# Patient Record
Sex: Female | Born: 1980 | Race: White | Hispanic: No | Marital: Married | Smoking: Never smoker
Health system: Southern US, Community
[De-identification: ages and names within clinical notes are randomized; demographics above are authoritative.]

## PROBLEM LIST (undated history)

## (undated) DIAGNOSIS — T7840XA Allergy, unspecified, initial encounter: Secondary | ICD-10-CM

## (undated) HISTORY — DX: Allergy, unspecified, initial encounter: T78.40XA

## (undated) HISTORY — PX: FRACTURE SURGERY: SHX138

---

## 2006-07-06 ENCOUNTER — Other Ambulatory Visit: Admission: RE | Admit: 2006-07-06 | Discharge: 2006-07-06 | Payer: Self-pay | Admitting: Obstetrics and Gynecology

## 2008-08-12 ENCOUNTER — Other Ambulatory Visit: Admission: RE | Admit: 2008-08-12 | Discharge: 2008-08-12 | Payer: Self-pay | Admitting: Obstetrics and Gynecology

## 2009-10-12 ENCOUNTER — Other Ambulatory Visit: Admission: RE | Admit: 2009-10-12 | Discharge: 2009-10-12 | Payer: Self-pay | Admitting: Obstetrics and Gynecology

## 2013-04-18 ENCOUNTER — Other Ambulatory Visit: Payer: Self-pay | Admitting: Obstetrics and Gynecology

## 2013-04-18 ENCOUNTER — Other Ambulatory Visit (HOSPITAL_COMMUNITY)
Admission: RE | Admit: 2013-04-18 | Discharge: 2013-04-18 | Disposition: A | Discharge status: Home / Self Care | Payer: BC Managed Care – PPO | Source: Ambulatory Visit | Attending: Obstetrics and Gynecology | Admitting: Obstetrics and Gynecology

## 2013-04-18 DIAGNOSIS — Z01419 Encounter for gynecological examination (general) (routine) without abnormal findings: Secondary | ICD-10-CM | POA: Insufficient documentation

## 2013-04-18 DIAGNOSIS — Z1151 Encounter for screening for human papillomavirus (HPV): Secondary | ICD-10-CM | POA: Insufficient documentation

## 2014-04-23 ENCOUNTER — Other Ambulatory Visit (HOSPITAL_COMMUNITY)
Admission: RE | Admit: 2014-04-23 | Discharge: 2014-04-23 | Disposition: A | Discharge status: Home / Self Care | Payer: BC Managed Care – PPO | Source: Ambulatory Visit | Attending: Obstetrics and Gynecology | Admitting: Obstetrics and Gynecology

## 2014-04-23 ENCOUNTER — Other Ambulatory Visit: Payer: Self-pay | Admitting: Obstetrics and Gynecology

## 2014-04-23 DIAGNOSIS — Z01419 Encounter for gynecological examination (general) (routine) without abnormal findings: Secondary | ICD-10-CM | POA: Insufficient documentation

## 2014-04-27 LAB — CYTOLOGY - PAP

## 2015-12-06 ENCOUNTER — Other Ambulatory Visit: Payer: Self-pay | Admitting: Obstetrics and Gynecology

## 2015-12-06 ENCOUNTER — Other Ambulatory Visit (HOSPITAL_COMMUNITY)
Admission: RE | Admit: 2015-12-06 | Discharge: 2015-12-06 | Disposition: A | Discharge status: Home / Self Care | Payer: BC Managed Care – PPO | Source: Ambulatory Visit | Attending: Obstetrics and Gynecology | Admitting: Obstetrics and Gynecology

## 2015-12-06 DIAGNOSIS — Z01419 Encounter for gynecological examination (general) (routine) without abnormal findings: Secondary | ICD-10-CM | POA: Diagnosis present

## 2015-12-09 LAB — CYTOLOGY - PAP

## 2016-12-06 ENCOUNTER — Other Ambulatory Visit (HOSPITAL_COMMUNITY)
Admission: RE | Admit: 2016-12-06 | Discharge: 2016-12-06 | Disposition: A | Discharge status: Home / Self Care | Payer: BC Managed Care – PPO | Source: Ambulatory Visit | Attending: Obstetrics and Gynecology | Admitting: Obstetrics and Gynecology

## 2016-12-06 ENCOUNTER — Other Ambulatory Visit: Payer: Self-pay | Admitting: Obstetrics and Gynecology

## 2016-12-06 DIAGNOSIS — Z01411 Encounter for gynecological examination (general) (routine) with abnormal findings: Secondary | ICD-10-CM | POA: Insufficient documentation

## 2016-12-06 DIAGNOSIS — Z1151 Encounter for screening for human papillomavirus (HPV): Secondary | ICD-10-CM | POA: Insufficient documentation

## 2016-12-11 LAB — CYTOLOGY - PAP
Diagnosis: NEGATIVE
HPV: NOT DETECTED

## 2019-03-24 ENCOUNTER — Other Ambulatory Visit: Payer: Self-pay | Admitting: *Deleted

## 2019-03-24 DIAGNOSIS — Z20822 Contact with and (suspected) exposure to covid-19: Secondary | ICD-10-CM

## 2019-03-27 NOTE — Addendum Note (Signed)
Addended by: Jazzmyne Rasnick M on: 03/27/2019 04:29 PM   Modules accepted: Orders  

## 2019-04-08 ENCOUNTER — Other Ambulatory Visit: Payer: Self-pay | Admitting: Obstetrics and Gynecology

## 2019-04-08 DIAGNOSIS — Z1231 Encounter for screening mammogram for malignant neoplasm of breast: Secondary | ICD-10-CM

## 2019-05-20 ENCOUNTER — Other Ambulatory Visit: Payer: Self-pay

## 2019-05-20 ENCOUNTER — Ambulatory Visit
Admission: RE | Admit: 2019-05-20 | Discharge: 2019-05-20 | Disposition: A | Discharge status: Home / Self Care | Payer: 59 | Source: Ambulatory Visit | Attending: Obstetrics and Gynecology | Admitting: Obstetrics and Gynecology

## 2019-05-20 DIAGNOSIS — Z1231 Encounter for screening mammogram for malignant neoplasm of breast: Secondary | ICD-10-CM

## 2019-09-12 DIAGNOSIS — R7989 Other specified abnormal findings of blood chemistry: Secondary | ICD-10-CM | POA: Insufficient documentation

## 2019-09-12 DIAGNOSIS — E782 Mixed hyperlipidemia: Secondary | ICD-10-CM | POA: Insufficient documentation

## 2020-05-17 ENCOUNTER — Other Ambulatory Visit: Payer: Self-pay | Admitting: Obstetrics and Gynecology

## 2020-05-17 DIAGNOSIS — Z1231 Encounter for screening mammogram for malignant neoplasm of breast: Secondary | ICD-10-CM

## 2020-05-25 ENCOUNTER — Ambulatory Visit
Admission: RE | Admit: 2020-05-25 | Discharge: 2020-05-25 | Disposition: A | Discharge status: Home / Self Care | Payer: 59 | Source: Ambulatory Visit | Attending: Obstetrics and Gynecology | Admitting: Obstetrics and Gynecology

## 2020-05-25 ENCOUNTER — Other Ambulatory Visit: Payer: Self-pay

## 2020-05-25 DIAGNOSIS — Z1231 Encounter for screening mammogram for malignant neoplasm of breast: Secondary | ICD-10-CM

## 2021-01-21 ENCOUNTER — Emergency Department (HOSPITAL_COMMUNITY): Payer: 59

## 2021-01-21 ENCOUNTER — Encounter (HOSPITAL_COMMUNITY): Payer: Self-pay

## 2021-01-21 ENCOUNTER — Emergency Department (HOSPITAL_COMMUNITY)
Admission: EM | Admit: 2021-01-21 | Discharge: 2021-01-21 | Disposition: A | Discharge status: Home / Self Care | Payer: 59 | Attending: Emergency Medicine | Admitting: Emergency Medicine

## 2021-01-21 ENCOUNTER — Encounter: Payer: Self-pay | Admitting: Emergency Medicine

## 2021-01-21 ENCOUNTER — Other Ambulatory Visit (HOSPITAL_COMMUNITY): Payer: Self-pay | Admitting: Emergency Medicine

## 2021-01-21 ENCOUNTER — Other Ambulatory Visit: Payer: Self-pay

## 2021-01-21 DIAGNOSIS — Y9301 Activity, walking, marching and hiking: Secondary | ICD-10-CM | POA: Diagnosis not present

## 2021-01-21 DIAGNOSIS — R52 Pain, unspecified: Secondary | ICD-10-CM

## 2021-01-21 DIAGNOSIS — M542 Cervicalgia: Secondary | ICD-10-CM | POA: Insufficient documentation

## 2021-01-21 DIAGNOSIS — S0093XA Contusion of unspecified part of head, initial encounter: Secondary | ICD-10-CM | POA: Diagnosis not present

## 2021-01-21 DIAGNOSIS — M25511 Pain in right shoulder: Secondary | ICD-10-CM | POA: Diagnosis not present

## 2021-01-21 DIAGNOSIS — M25532 Pain in left wrist: Secondary | ICD-10-CM | POA: Insufficient documentation

## 2021-01-21 DIAGNOSIS — M25571 Pain in right ankle and joints of right foot: Secondary | ICD-10-CM | POA: Diagnosis not present

## 2021-01-21 DIAGNOSIS — S0990XA Unspecified injury of head, initial encounter: Secondary | ICD-10-CM | POA: Diagnosis present

## 2021-01-21 DIAGNOSIS — Y92480 Sidewalk as the place of occurrence of the external cause: Secondary | ICD-10-CM | POA: Diagnosis not present

## 2021-01-21 DIAGNOSIS — Z20822 Contact with and (suspected) exposure to covid-19: Secondary | ICD-10-CM | POA: Diagnosis not present

## 2021-01-21 DIAGNOSIS — S62102A Fracture of unspecified carpal bone, left wrist, initial encounter for closed fracture: Secondary | ICD-10-CM

## 2021-01-21 DIAGNOSIS — S3991XA Unspecified injury of abdomen, initial encounter: Secondary | ICD-10-CM | POA: Insufficient documentation

## 2021-01-21 DIAGNOSIS — T1490XA Injury, unspecified, initial encounter: Secondary | ICD-10-CM

## 2021-01-21 LAB — PROTIME-INR
INR: 1 (ref 0.8–1.2)
Prothrombin Time: 13 seconds (ref 11.4–15.2)

## 2021-01-21 LAB — CBC
HCT: 37.3 % (ref 36.0–46.0)
Hemoglobin: 12.5 g/dL (ref 12.0–15.0)
MCH: 30 pg (ref 26.0–34.0)
MCHC: 33.5 g/dL (ref 30.0–36.0)
MCV: 89.4 fL (ref 80.0–100.0)
Platelets: 242 10*3/uL (ref 150–400)
RBC: 4.17 MIL/uL (ref 3.87–5.11)
RDW: 11.9 % (ref 11.5–15.5)
WBC: 6.9 10*3/uL (ref 4.0–10.5)
nRBC: 0 % (ref 0.0–0.2)

## 2021-01-21 LAB — URINALYSIS, ROUTINE W REFLEX MICROSCOPIC
Bilirubin Urine: NEGATIVE
Glucose, UA: NEGATIVE mg/dL
Ketones, ur: 5 mg/dL — AB
Nitrite: NEGATIVE
Protein, ur: NEGATIVE mg/dL
Specific Gravity, Urine: >1.046 — ABNORMAL HIGH (ref 1.005–1.030)
pH: 6 (ref 5.0–8.0)

## 2021-01-21 LAB — COMPREHENSIVE METABOLIC PANEL
ALT: 13 U/L (ref 0–44)
AST: 21 U/L (ref 15–41)
Albumin: 3.7 g/dL (ref 3.5–5.0)
Alkaline Phosphatase: 60 U/L (ref 38–126)
Anion gap: 7 (ref 5–15)
BUN: 10 mg/dL (ref 6–20)
CO2: 23 mmol/L (ref 22–32)
Calcium: 8.8 mg/dL — ABNORMAL LOW (ref 8.9–10.3)
Chloride: 104 mmol/L (ref 98–111)
Creatinine, Ser: 0.71 mg/dL (ref 0.44–1.00)
GFR, Estimated: >60 mL/min (ref >60)
Glucose, Bld: 156 mg/dL — ABNORMAL HIGH (ref 70–99)
Potassium: 3.3 mmol/L — ABNORMAL LOW (ref 3.5–5.1)
Sodium: 134 mmol/L — ABNORMAL LOW (ref 135–145)
Total Bilirubin: 0.6 mg/dL (ref 0.3–1.2)
Total Protein: 7.2 g/dL (ref 6.5–8.1)

## 2021-01-21 LAB — ETHANOL: Alcohol, Ethyl (B): <10 mg/dL (ref <10)

## 2021-01-21 LAB — I-STAT BETA HCG BLOOD, ED (MC, WL, AP ONLY): I-stat hCG, quantitative: <5 m[IU]/mL (ref <5)

## 2021-01-21 LAB — I-STAT CHEM 8, ED
BUN: 10 mg/dL (ref 6–20)
Calcium, Ion: 1.06 mmol/L — ABNORMAL LOW (ref 1.15–1.40)
Chloride: 104 mmol/L (ref 98–111)
Creatinine, Ser: 0.6 mg/dL (ref 0.44–1.00)
Glucose, Bld: 152 mg/dL — ABNORMAL HIGH (ref 70–99)
HCT: 36 % (ref 36.0–46.0)
Hemoglobin: 12.2 g/dL (ref 12.0–15.0)
Potassium: 3.4 mmol/L — ABNORMAL LOW (ref 3.5–5.1)
Sodium: 138 mmol/L (ref 135–145)
TCO2: 21 mmol/L — ABNORMAL LOW (ref 22–32)

## 2021-01-21 LAB — RESP PANEL BY RT-PCR (FLU A&B, COVID) ARPGX2
Influenza A by PCR: NEGATIVE
Influenza B by PCR: NEGATIVE
SARS Coronavirus 2 by RT PCR: NEGATIVE

## 2021-01-21 LAB — LACTIC ACID, PLASMA: Lactic Acid, Venous: 2.1 mmol/L (ref 0.5–1.9)

## 2021-01-21 MED ORDER — FENTANYL CITRATE (PF) 100 MCG/2ML IJ SOLN
50.0000 ug | Freq: Once | INTRAMUSCULAR | Status: AC
  Administered 2021-01-21: 50 ug via INTRAVENOUS
  Filled 2021-01-21: qty 2

## 2021-01-21 MED ORDER — FENTANYL CITRATE (PF) 100 MCG/2ML IJ SOLN
50.0000 ug | Freq: Once | INTRAMUSCULAR | Status: AC
Start: 2021-01-21 — End: 2021-01-21
  Administered 2021-01-21: 50 ug via INTRAVENOUS
  Filled 2021-01-21: qty 2

## 2021-01-21 MED ORDER — LIDOCAINE HCL (PF) 1 % IJ SOLN
30.0000 mL | Freq: Once | INTRAMUSCULAR | Status: AC
  Administered 2021-01-21: 30 mL via INTRADERMAL
  Filled 2021-01-21: qty 30

## 2021-01-21 MED ORDER — IOHEXOL 300 MG/ML  SOLN
100.0000 mL | Freq: Once | INTRAMUSCULAR | Status: AC | PRN
  Administered 2021-01-21: 100 mL via INTRAVENOUS
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
## 2021-01-21 NOTE — ED Notes (Signed)
Pt back in room from CT 
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
## 2021-01-21 NOTE — ED Notes (Signed)
Reviewed discharge instructions with patient and spouse. Follow-up care and pain management reviewed. Patient and spouse verbalized understanding. Patient A&Ox4, VSS upon discharge.
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
## 2021-01-22 LAB — SAMPLE TO BLOOD BANK

## 2021-01-25 ENCOUNTER — Other Ambulatory Visit: Payer: Self-pay

## 2021-01-25 ENCOUNTER — Encounter (HOSPITAL_BASED_OUTPATIENT_CLINIC_OR_DEPARTMENT_OTHER): Payer: Self-pay | Admitting: Orthopaedic Surgery

## 2021-01-27 ENCOUNTER — Other Ambulatory Visit (HOSPITAL_COMMUNITY)
Admission: RE | Admit: 2021-01-27 | Discharge: 2021-01-27 | Disposition: A | Discharge status: Home / Self Care | Payer: 59 | Source: Ambulatory Visit | Attending: Orthopaedic Surgery | Admitting: Orthopaedic Surgery

## 2021-01-27 DIAGNOSIS — Z01812 Encounter for preprocedural laboratory examination: Secondary | ICD-10-CM | POA: Insufficient documentation

## 2021-01-27 DIAGNOSIS — Z20822 Contact with and (suspected) exposure to covid-19: Secondary | ICD-10-CM | POA: Diagnosis not present

## 2021-01-27 LAB — SARS CORONAVIRUS 2 (TAT 6-24 HRS): SARS Coronavirus 2: NEGATIVE

## 2021-01-31 ENCOUNTER — Ambulatory Visit (HOSPITAL_BASED_OUTPATIENT_CLINIC_OR_DEPARTMENT_OTHER)
Admission: RE | Admit: 2021-01-31 | Discharge: 2021-01-31 | Disposition: A | Discharge status: Home / Self Care | Payer: 59 | Attending: Orthopaedic Surgery | Admitting: Orthopaedic Surgery

## 2021-01-31 ENCOUNTER — Encounter (HOSPITAL_BASED_OUTPATIENT_CLINIC_OR_DEPARTMENT_OTHER)
Admission: RE | Disposition: A | Discharge status: Home / Self Care | Payer: Self-pay | Source: Home / Self Care | Attending: Orthopaedic Surgery

## 2021-01-31 ENCOUNTER — Ambulatory Visit (HOSPITAL_BASED_OUTPATIENT_CLINIC_OR_DEPARTMENT_OTHER): Payer: 59 | Admitting: Anesthesiology

## 2021-01-31 ENCOUNTER — Other Ambulatory Visit: Payer: Self-pay

## 2021-01-31 ENCOUNTER — Encounter (HOSPITAL_BASED_OUTPATIENT_CLINIC_OR_DEPARTMENT_OTHER): Payer: Self-pay | Admitting: Orthopaedic Surgery

## 2021-01-31 DIAGNOSIS — Z881 Allergy status to other antibiotic agents status: Secondary | ICD-10-CM | POA: Diagnosis not present

## 2021-01-31 DIAGNOSIS — S52572A Other intraarticular fracture of lower end of left radius, initial encounter for closed fracture: Secondary | ICD-10-CM | POA: Insufficient documentation

## 2021-01-31 HISTORY — PX: OPEN REDUCTION INTERNAL FIXATION (ORIF) DISTAL RADIAL FRACTURE: SHX5989

## 2021-01-31 LAB — POCT PREGNANCY, URINE: Preg Test, Ur: NEGATIVE

## 2021-01-31 SURGERY — OPEN REDUCTION INTERNAL FIXATION (ORIF) DISTAL RADIUS FRACTURE
Anesthesia: Monitor Anesthesia Care | Site: Wrist | Laterality: Left

## 2021-01-31 MED ORDER — ROPIVACAINE HCL 5 MG/ML IJ SOLN
INTRAMUSCULAR | Status: DC | PRN
  Administered 2021-01-31: 20 mL via PERINEURAL

## 2021-01-31 MED ORDER — PROPOFOL 500 MG/50ML IV EMUL
INTRAVENOUS | Status: DC | PRN
  Administered 2021-01-31: 75 ug/kg/min via INTRAVENOUS

## 2021-01-31 MED ORDER — LACTATED RINGERS IV SOLN: INTRAVENOUS | Status: DC

## 2021-01-31 MED ORDER — ACETAMINOPHEN 500 MG PO TABS
ORAL_TABLET | ORAL | Status: AC
  Filled 2021-01-31: qty 2

## 2021-01-31 MED ORDER — DEXAMETHASONE SODIUM PHOSPHATE 10 MG/ML IJ SOLN
INTRAMUSCULAR | Status: DC | PRN
  Administered 2021-01-31: 5 mg

## 2021-01-31 MED ORDER — PROPOFOL 10 MG/ML IV BOLUS
INTRAVENOUS | Status: AC
  Filled 2021-01-31: qty 20

## 2021-01-31 MED ORDER — ONDANSETRON HCL 4 MG/2ML IJ SOLN
INTRAMUSCULAR | Status: AC
  Filled 2021-01-31: qty 2

## 2021-01-31 MED ORDER — MIDAZOLAM HCL 2 MG/2ML IJ SOLN
2.0000 mg | Freq: Once | INTRAMUSCULAR | Status: AC
  Administered 2021-01-31: 2 mg via INTRAVENOUS

## 2021-01-31 MED ORDER — PROPOFOL 500 MG/50ML IV EMUL
INTRAVENOUS | Status: AC
  Filled 2021-01-31: qty 50

## 2021-01-31 MED ORDER — CHLORHEXIDINE GLUCONATE 4 % EX LIQD: 60.0000 mL | Freq: Once | CUTANEOUS | Status: DC

## 2021-01-31 MED ORDER — FENTANYL CITRATE (PF) 100 MCG/2ML IJ SOLN
INTRAMUSCULAR | Status: AC
  Filled 2021-01-31: qty 2

## 2021-01-31 MED ORDER — ONDANSETRON HCL 4 MG/2ML IJ SOLN
INTRAMUSCULAR | Status: DC | PRN
  Administered 2021-01-31: 4 mg via INTRAVENOUS

## 2021-01-31 MED ORDER — VANCOMYCIN HCL IN DEXTROSE 1-5 GM/200ML-% IV SOLN: 1000.0000 mg | INTRAVENOUS | Status: DC

## 2021-01-31 MED ORDER — HYDROCODONE-ACETAMINOPHEN 5-325 MG PO TABS: 2.0000 | ORAL_TABLET | Freq: Four times a day (QID) | ORAL | 0 refills | Status: AC | PRN

## 2021-01-31 MED ORDER — POVIDONE-IODINE 10 % EX SWAB: 2.0000 "application " | Freq: Once | CUTANEOUS | Status: DC

## 2021-01-31 MED ORDER — FENTANYL CITRATE (PF) 100 MCG/2ML IJ SOLN
50.0000 ug | Freq: Once | INTRAMUSCULAR | Status: AC
  Administered 2021-01-31: 50 ug via INTRAVENOUS

## 2021-01-31 MED ORDER — MIDAZOLAM HCL 2 MG/2ML IJ SOLN
INTRAMUSCULAR | Status: AC
  Filled 2021-01-31: qty 2

## 2021-01-31 MED ORDER — FENTANYL CITRATE (PF) 100 MCG/2ML IJ SOLN: 25.0000 ug | INTRAMUSCULAR | Status: DC | PRN

## 2021-01-31 MED ORDER — ACETAMINOPHEN 500 MG PO TABS
1000.0000 mg | ORAL_TABLET | Freq: Once | ORAL | Status: AC
  Administered 2021-01-31: 1000 mg via ORAL

## 2021-01-31 MED ORDER — VANCOMYCIN HCL IN DEXTROSE 1-5 GM/200ML-% IV SOLN
INTRAVENOUS | Status: AC
  Filled 2021-01-31: qty 200

## 2021-01-31 MED ORDER — VANCOMYCIN HCL 1000 MG IV SOLR
INTRAVENOUS | Status: DC | PRN
  Administered 2021-01-31: 1000 mg via INTRAVENOUS

## 2021-01-31 SURGICAL SUPPLY — 77 items
APL PRP STRL LF DISP 70% ISPRP (MISCELLANEOUS) ×1
BIT DRILL 2.0 LNG QUCK RELEASE (BIT) IMPLANT
BIT DRILL QC 2.8X5 (BIT) ×1 IMPLANT
BLADE SURG 15 STRL LF DISP TIS (BLADE) ×2 IMPLANT
BLADE SURG 15 STRL SS (BLADE) ×4
BNDG CMPR 9X4 STRL LF SNTH (GAUZE/BANDAGES/DRESSINGS) ×1
BNDG ELASTIC 3X5.8 VLCR STR LF (GAUZE/BANDAGES/DRESSINGS) ×2 IMPLANT
BNDG ELASTIC 4X5.8 VLCR STR LF (GAUZE/BANDAGES/DRESSINGS) ×2 IMPLANT
BNDG ESMARK 4X9 LF (GAUZE/BANDAGES/DRESSINGS) ×2 IMPLANT
BNDG GAUZE ELAST 4 BULKY (GAUZE/BANDAGES/DRESSINGS) ×2 IMPLANT
BRUSH SCRUB EZ PLAIN DRY (MISCELLANEOUS) IMPLANT
CANISTER SUCT 1200ML W/VALVE (MISCELLANEOUS) ×2 IMPLANT
CHLORAPREP W/TINT 26 (MISCELLANEOUS) ×2 IMPLANT
CORD BIPOLAR FORCEPS 12FT (ELECTRODE) ×2 IMPLANT
COVER BACK TABLE 60X90IN (DRAPES) ×2 IMPLANT
CUFF TOURN SGL QUICK 18X4 (TOURNIQUET CUFF) ×1 IMPLANT
DRAPE EXTREMITY T 121X128X90 (DISPOSABLE) ×2 IMPLANT
DRAPE OEC MINIVIEW 54X84 (DRAPES) ×2 IMPLANT
DRAPE SURG 17X23 STRL (DRAPES) ×2 IMPLANT
DRILL 2.0 LNG QUICK RELEASE (BIT) ×2
GAUZE 4X4 16PLY RFD (DISPOSABLE) IMPLANT
GAUZE SPONGE 4X4 12PLY STRL (GAUZE/BANDAGES/DRESSINGS) ×2 IMPLANT
GAUZE XEROFORM 1X8 LF (GAUZE/BANDAGES/DRESSINGS) ×1 IMPLANT
GAUZE XEROFORM 5X9 LF (GAUZE/BANDAGES/DRESSINGS) IMPLANT
GLOVE SRG 8 PF TXTR STRL LF DI (GLOVE) ×1 IMPLANT
GLOVE SURG ENC MOIS LTX SZ6.5 (GLOVE) ×3 IMPLANT
GLOVE SURG SYN 7.5  E (GLOVE)
GLOVE SURG SYN 7.5 E (GLOVE) IMPLANT
GLOVE SURG SYN 7.5 PF PI (GLOVE) IMPLANT
GLOVE SURG UNDER POLY LF SZ7 (GLOVE) ×3 IMPLANT
GLOVE SURG UNDER POLY LF SZ8 (GLOVE) ×2
GOWN STRL REUS W/ TWL LRG LVL3 (GOWN DISPOSABLE) ×1 IMPLANT
GOWN STRL REUS W/ TWL XL LVL3 (GOWN DISPOSABLE) ×1 IMPLANT
GOWN STRL REUS W/TWL LRG LVL3 (GOWN DISPOSABLE) ×2
GOWN STRL REUS W/TWL XL LVL3 (GOWN DISPOSABLE) ×4
GUIDEWIRE ORTHO 0.054X6 (WIRE) ×3 IMPLANT
NDL HYPO 25X1 1.5 SAFETY (NEEDLE) IMPLANT
NEEDLE HYPO 22GX1.5 SAFETY (NEEDLE) IMPLANT
NEEDLE HYPO 25X1 1.5 SAFETY (NEEDLE) IMPLANT
NS IRRIG 1000ML POUR BTL (IV SOLUTION) ×2 IMPLANT
PACK BASIN DAY SURGERY FS (CUSTOM PROCEDURE TRAY) ×2 IMPLANT
PAD CAST 3X4 CTTN HI CHSV (CAST SUPPLIES) ×2 IMPLANT
PAD CAST 4YDX4 CTTN HI CHSV (CAST SUPPLIES) ×1 IMPLANT
PADDING CAST ABS 3INX4YD NS (CAST SUPPLIES) ×1
PADDING CAST ABS 4INX4YD NS (CAST SUPPLIES) ×1
PADDING CAST ABS COTTON 3X4 (CAST SUPPLIES) ×1 IMPLANT
PADDING CAST ABS COTTON 4X4 ST (CAST SUPPLIES) ×1 IMPLANT
PADDING CAST COTTON 3X4 STRL (CAST SUPPLIES) ×4
PADDING CAST COTTON 4X4 STRL (CAST SUPPLIES) ×2
PLATE LEFT DIST RADIUS NARROW (Plate) ×1 IMPLANT
SCREW BN FT 16X2.3XLCK HEX CRT (Screw) IMPLANT
SCREW CORT FT 18X2.3XLCK HEX (Screw) IMPLANT
SCREW CORTICAL LOCKING 2.3X14M (Screw) ×2 IMPLANT
SCREW CORTICAL LOCKING 2.3X16M (Screw) ×2 IMPLANT
SCREW CORTICAL LOCKING 2.3X18M (Screw) ×6 IMPLANT
SCREW LOCK 12X3.5X HEXALOBE (Screw) IMPLANT
SCREW LOCKING 3.5X10MM (Screw) ×1 IMPLANT
SCREW LOCKING 3.5X12 (Screw) ×2 IMPLANT
SCREW NON LOCK 3.5X10MM (Screw) ×1 IMPLANT
SHEET MEDIUM DRAPE 40X70 STRL (DRAPES) ×2 IMPLANT
SLING ARM FOAM STRAP LRG (SOFTGOODS) ×1 IMPLANT
SPLINT FIBERGLASS 3X35 (CAST SUPPLIES) ×1 IMPLANT
SPLINT PLASTER CAST XFAST 3X15 (CAST SUPPLIES) IMPLANT
SPLINT PLASTER XTRA FASTSET 3X (CAST SUPPLIES)
STOCKINETTE SYNTHETIC 3 UNSTER (CAST SUPPLIES) ×2 IMPLANT
SUCTION FRAZIER HANDLE 10FR (MISCELLANEOUS) ×2
SUCTION TUBE FRAZIER 10FR DISP (MISCELLANEOUS) ×1 IMPLANT
SUT ETHIBOND 3-0 V-5 (SUTURE) IMPLANT
SUT PROLENE 4 0 PS 2 18 (SUTURE) ×4 IMPLANT
SUT VIC AB 3-0 FS2 27 (SUTURE) ×2 IMPLANT
SYR BULB EAR ULCER 3OZ GRN STR (SYRINGE) ×2 IMPLANT
SYR CONTROL 10ML LL (SYRINGE) IMPLANT
TAPE SURG TRANSPORE 1 IN (GAUZE/BANDAGES/DRESSINGS) ×1 IMPLANT
TAPE SURGICAL TRANSPORE 1 IN (GAUZE/BANDAGES/DRESSINGS) ×2
TOWEL GREEN STERILE FF (TOWEL DISPOSABLE) ×4 IMPLANT
TUBE CONNECTING 20X1/4 (TUBING) ×2 IMPLANT
UNDERPAD 30X36 HEAVY ABSORB (UNDERPADS AND DIAPERS) ×2 IMPLANT
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
## 2021-01-31 NOTE — Anesthesia Postprocedure Evaluation (Signed)
Anesthesia Post Note  Patient: Pamela Kline  Procedure(s) Performed: OPEN REDUCTION INTERNAL FIXATION (ORIF) DISTAL RADIAL FRACTURE (Left Wrist)     Patient location during evaluation: PACU Anesthesia Type: Regional and MAC Level of consciousness: awake and alert Pain management: pain level controlled Vital Signs Assessment: post-procedure vital signs reviewed and stable Respiratory status: spontaneous breathing, nonlabored ventilation, respiratory function stable and patient connected to nasal cannula oxygen Cardiovascular status: stable and blood pressure returned to baseline Postop Assessment: no apparent nausea or vomiting Anesthetic complications: no   No complications documented.  Last Vitals:  Vitals:   01/31/21 0930 01/31/21 0945  BP: (!) 142/69 (!) 142/77  Pulse: 78 84  Resp: 15 16  Temp:    SpO2: 99% 97%    Last Pain:  Vitals:   01/31/21 0945  TempSrc:   PainSc: 0-No pain                 Tashiya Souders L Ndeye Tenorio

## 2021-01-31 NOTE — Progress Notes (Signed)
Assisted Dr. Woodrum with left, ultrasound guided, supraclavicular block. Side rails up, monitors on throughout procedure. See vital signs in flow sheet. Tolerated Procedure well. 
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
## 2021-01-31 NOTE — Discharge Instructions (Signed)
Discharge Instructions  - Keep dressings in place. Do not remove them. - The dressings must stay dry - Take all medication as prescribed. Transition to over the counter pain medication as your pain improves - Keep the hand elevated over the next 48-72 hours to help with pain and swelling - Move all digits not restricted by the dressings regularly to prevent stiffness - Please call to schedule a follow up appointment with Dr. Roney Mans and therapy at [phone] for 10-14 days following surgery- - Your pain medication have been sent digitally to your pharmacy  *No Tylenol until 1:45pm today 01/31/21  Post Anesthesia Home Care Instructions  Activity: Get plenty of rest for the remainder of the day. A responsible individual must stay with you for 24 hours following the procedure.  For the next 24 hours, DO NOT: -Drive a car -Advertising copywriter -Drink alcoholic beverages -Take any medication unless instructed by your physician -Make any legal decisions or sign important papers.  Meals: Start with liquid foods such as gelatin or soup. Progress to regular foods as tolerated. Avoid greasy, spicy, heavy foods. If nausea and/or vomiting occur, drink only clear liquids until the nausea and/or vomiting subsides. Call your physician if vomiting continues.  Special Instructions/Symptoms: Your throat may feel dry or sore from the anesthesia or the breathing tube placed in your throat during surgery. If this causes discomfort, gargle with warm salt water. The discomfort should disappear within 24 hours.  If you had a scopolamine patch placed behind your ear for the management of post- operative nausea and/or vomiting:  1. The medication in the patch is effective for 72 hours, after which it should be removed.  Wrap patch in a tissue and discard in the trash. Wash hands thoroughly with soap and water. 2. You may remove the patch earlier than 72 hours if you experience unpleasant side effects which  may include dry mouth, dizziness or visual disturbances. 3. Avoid touching the patch. Wash your hands with soap and water after contact with the patch.     Regional Anesthesia Blocks  1. Numbness or the inability to move the "blocked" extremity may last from 3-48 hours after placement. The length of time depends on the medication injected and your individual response to the medication. If the numbness is not going away after 48 hours, call your surgeon.  2. The extremity that is blocked will need to be protected until the numbness is gone and the  Strength has returned. Because you cannot feel it, you will need to take extra care to avoid injury. Because it may be weak, you may have difficulty moving it or using it. You may not know what position it is in without looking at it while the block is in effect.  3. For blocks in the legs and feet, returning to weight bearing and walking needs to be done carefully. You will need to wait until the numbness is entirely gone and the strength has returned. You should be able to move your leg and foot normally before you try and bear weight or walk. You will need someone to be with you when you first try to ensure you do not fall and possibly risk injury.  4. Bruising and tenderness at the needle site are common side effects and will resolve in a few days.  5. Persistent numbness or new problems with movement should be communicated to the surgeon or the [facility] [phone] [facility] [phone]).

## 2021-01-31 NOTE — Anesthesia Procedure Notes (Signed)
Anesthesia Regional Block: Supraclavicular block   Pre-Anesthetic Checklist: ,, timeout performed, Correct Patient, Correct Site, Correct Laterality, Correct Procedure, Correct Position, site marked, Risks and benefits discussed,  Surgical consent,  Pre-op evaluation,  At surgeon's request and post-op pain management  Laterality: Left  Prep: Maximum Sterile Barrier Precautions used, chloraprep       Needles:  Injection technique: Single-shot  Needle Type: Echogenic Stimulator Needle     Needle Length: 4cm  Needle Gauge: 22     Additional Needles:   Procedures:,,,, ultrasound used (permanent image in chart),,,,  Narrative:  Start time: 01/31/2021 7:55 AM End time: 01/31/2021 8:05 AM Injection made incrementally with aspirations every 5 mL.  Performed by: Personally  Anesthesiologist: Elmer Picker, MD  Additional Notes: Monitors applied. No increased pain on injection. No increased resistance to injection. Injection made in 5cc increments. Good needle visualization. Patient tolerated procedure well.

## 2021-01-31 NOTE — Transfer of Care (Signed)
Immediate Anesthesia Transfer of Care Note  Patient: Pamela Kline  Procedure(s) Performed: OPEN REDUCTION INTERNAL FIXATION (ORIF) DISTAL RADIAL FRACTURE (Left Wrist)  Patient Location: PACU  Anesthesia Type:MAC and Regional  Level of Consciousness: awake, alert  and oriented  Airway & Oxygen Therapy: spontaneously breathing with face mask oxygen  Post-op Assessment: Report given to RN and Post -op Vital signs reviewed and stable  Post vital signs: Reviewed and stable  Last Vitals:  Vitals Value Taken Time  BP 128/63 01/31/21 0919  Temp    Pulse 81 01/31/21 0920  Resp 14 01/31/21 0919  SpO2 100 % 01/31/21 0920  Vitals shown include unvalidated device data.  Last Pain:  Vitals:   01/31/21 0725  TempSrc: Oral  PainSc: 4          Complications: No complications documented.

## 2021-01-31 NOTE — Anesthesia Preprocedure Evaluation (Addendum)
Anesthesia Evaluation  Patient identified by MRN, date of birth, ID band Patient awake    Reviewed: Allergy & Precautions, NPO status , Patient's Chart, lab work & pertinent test results  Airway Mallampati: II  TM Distance: >3 FB Neck ROM: Full    Dental no notable dental hx. (+) Teeth Intact, Dental Advisory Given   Pulmonary neg pulmonary ROS,    Pulmonary exam normal breath sounds clear to auscultation       Cardiovascular Normal cardiovascular exam Rhythm:Regular Rate:Normal  HLD   Neuro/Psych negative neurological ROS  negative psych ROS   GI/Hepatic negative GI ROS, Neg liver ROS,   Endo/Other  negative endocrine ROS  Renal/GU negative Renal ROS  negative genitourinary   Musculoskeletal negative musculoskeletal ROS (+)   Abdominal   Peds  Hematology negative hematology ROS (+)   Anesthesia Other Findings   Reproductive/Obstetrics                            Anesthesia Physical Anesthesia Plan  ASA: II  Anesthesia Plan: MAC and Regional   Post-op Pain Management:  Regional for Post-op pain   Induction: Intravenous  PONV Risk Score and Plan: 2 and Propofol infusion, Treatment may vary due to age or medical condition, Midazolam, Ondansetron and Dexamethasone  Airway Management Planned: Natural Airway  Additional Equipment:   Intra-op Plan:   Post-operative Plan:   Informed Consent: I have reviewed the patients History and Physical, chart, labs and discussed the procedure including the risks, benefits and alternatives for the proposed anesthesia with the patient or authorized representative who has indicated his/her understanding and acceptance.     Dental advisory given  Plan Discussed with: CRNA  Anesthesia Plan Comments:         Anesthesia Quick Evaluation

## 2021-02-01 ENCOUNTER — Encounter (HOSPITAL_BASED_OUTPATIENT_CLINIC_OR_DEPARTMENT_OTHER): Payer: Self-pay | Admitting: Orthopaedic Surgery

## 2021-05-20 ENCOUNTER — Other Ambulatory Visit: Payer: Self-pay | Admitting: Obstetrics and Gynecology

## 2021-05-20 DIAGNOSIS — Z1231 Encounter for screening mammogram for malignant neoplasm of breast: Secondary | ICD-10-CM

## 2021-06-16 ENCOUNTER — Ambulatory Visit
Admission: RE | Admit: 2021-06-16 | Discharge: 2021-06-16 | Disposition: A | Discharge status: Home / Self Care | Payer: 59 | Source: Ambulatory Visit

## 2021-06-16 ENCOUNTER — Other Ambulatory Visit: Payer: Self-pay

## 2021-06-16 DIAGNOSIS — Z1231 Encounter for screening mammogram for malignant neoplasm of breast: Secondary | ICD-10-CM
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
## 2022-06-29 DIAGNOSIS — Z1231 Encounter for screening mammogram for malignant neoplasm of breast: Secondary | ICD-10-CM | POA: Diagnosis not present

## 2022-07-27 DIAGNOSIS — Z01419 Encounter for gynecological examination (general) (routine) without abnormal findings: Secondary | ICD-10-CM | POA: Diagnosis not present

## 2022-09-15 DIAGNOSIS — I10 Essential (primary) hypertension: Secondary | ICD-10-CM | POA: Diagnosis not present

## 2022-09-15 DIAGNOSIS — E782 Mixed hyperlipidemia: Secondary | ICD-10-CM | POA: Diagnosis not present

## 2022-09-15 DIAGNOSIS — E559 Vitamin D deficiency, unspecified: Secondary | ICD-10-CM | POA: Diagnosis not present

## 2022-12-13 DIAGNOSIS — E559 Vitamin D deficiency, unspecified: Secondary | ICD-10-CM | POA: Diagnosis not present

## 2022-12-13 DIAGNOSIS — Z09 Encounter for follow-up examination after completed treatment for conditions other than malignant neoplasm: Secondary | ICD-10-CM | POA: Diagnosis not present

## 2022-12-13 DIAGNOSIS — E782 Mixed hyperlipidemia: Secondary | ICD-10-CM | POA: Diagnosis not present

## 2022-12-13 DIAGNOSIS — Z0000 Encounter for general adult medical examination without abnormal findings: Secondary | ICD-10-CM | POA: Diagnosis not present

## 2023-07-31 DIAGNOSIS — Z124 Encounter for screening for malignant neoplasm of cervix: Secondary | ICD-10-CM | POA: Diagnosis not present

## 2023-07-31 DIAGNOSIS — Z01419 Encounter for gynecological examination (general) (routine) without abnormal findings: Secondary | ICD-10-CM | POA: Diagnosis not present

## 2023-07-31 DIAGNOSIS — Z1151 Encounter for screening for human papillomavirus (HPV): Secondary | ICD-10-CM | POA: Diagnosis not present

## 2023-08-01 LAB — HM PAP SMEAR: HPV, high-risk: NEGATIVE

## 2023-08-06 DIAGNOSIS — Z1231 Encounter for screening mammogram for malignant neoplasm of breast: Secondary | ICD-10-CM | POA: Diagnosis not present

## 2023-08-06 LAB — HM MAMMOGRAPHY

## 2023-11-19 DIAGNOSIS — N6459 Other signs and symptoms in breast: Secondary | ICD-10-CM | POA: Diagnosis not present

## 2023-12-17 DIAGNOSIS — Z7689 Persons encountering health services in other specified circumstances: Secondary | ICD-10-CM | POA: Diagnosis not present

## 2024-03-27 ENCOUNTER — Ambulatory Visit: Payer: Self-pay | Admitting: Internal Medicine

## 2024-03-27 ENCOUNTER — Encounter: Payer: Self-pay | Admitting: Internal Medicine

## 2024-03-27 VITALS — BP 110/80 | HR 82 | Temp 99.1°F | Ht 62.0 in | Wt 166.3 lb

## 2024-03-27 DIAGNOSIS — R7989 Other specified abnormal findings of blood chemistry: Secondary | ICD-10-CM | POA: Diagnosis not present

## 2024-03-27 DIAGNOSIS — E782 Mixed hyperlipidemia: Secondary | ICD-10-CM

## 2024-03-27 DIAGNOSIS — Z1211 Encounter for screening for malignant neoplasm of colon: Secondary | ICD-10-CM | POA: Diagnosis not present

## 2024-03-27 DIAGNOSIS — Z1159 Encounter for screening for other viral diseases: Secondary | ICD-10-CM

## 2024-03-27 DIAGNOSIS — Z114 Encounter for screening for human immunodeficiency virus [HIV]: Secondary | ICD-10-CM

## 2024-03-27 DIAGNOSIS — Z0000 Encounter for general adult medical examination without abnormal findings: Secondary | ICD-10-CM
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
## 2024-03-31 ENCOUNTER — Other Ambulatory Visit (INDEPENDENT_AMBULATORY_CARE_PROVIDER_SITE_OTHER)

## 2024-03-31 DIAGNOSIS — R7989 Other specified abnormal findings of blood chemistry: Secondary | ICD-10-CM | POA: Diagnosis not present

## 2024-03-31 DIAGNOSIS — Z114 Encounter for screening for human immunodeficiency virus [HIV]: Secondary | ICD-10-CM

## 2024-03-31 DIAGNOSIS — Z1159 Encounter for screening for other viral diseases: Secondary | ICD-10-CM | POA: Diagnosis not present

## 2024-03-31 DIAGNOSIS — E782 Mixed hyperlipidemia: Secondary | ICD-10-CM | POA: Diagnosis not present

## 2024-03-31 LAB — COMPREHENSIVE METABOLIC PANEL WITH GFR
ALT: 8 U/L (ref 0–35)
AST: 15 U/L (ref 0–37)
Albumin: 4.4 g/dL (ref 3.5–5.2)
Alkaline Phosphatase: 49 U/L (ref 39–117)
BUN: 16 mg/dL (ref 6–23)
CO2: 26 meq/L (ref 19–32)
Calcium: 9.2 mg/dL (ref 8.4–10.5)
Chloride: 104 meq/L (ref 96–112)
Creatinine, Ser: 0.58 mg/dL (ref 0.40–1.20)
GFR: 109.39 mL/min (ref >60.00)
Glucose, Bld: 102 mg/dL — ABNORMAL HIGH (ref 70–99)
Potassium: 4 meq/L (ref 3.5–5.1)
Sodium: 138 meq/L (ref 135–145)
Total Bilirubin: 0.6 mg/dL (ref 0.2–1.2)
Total Protein: 7.6 g/dL (ref 6.0–8.3)

## 2024-03-31 LAB — CBC WITH DIFFERENTIAL/PLATELET
Basophils Absolute: 0 10*3/uL (ref 0.0–0.1)
Basophils Relative: 0.5 % (ref 0.0–3.0)
Eosinophils Absolute: 0 10*3/uL (ref 0.0–0.7)
Eosinophils Relative: 0.6 % (ref 0.0–5.0)
HCT: 37.1 % (ref 36.0–46.0)
Hemoglobin: 12.6 g/dL (ref 12.0–15.0)
Lymphocytes Relative: 31.6 % (ref 12.0–46.0)
Lymphs Abs: 1.6 10*3/uL (ref 0.7–4.0)
MCHC: 34 g/dL (ref 30.0–36.0)
MCV: 88.6 fl (ref 78.0–100.0)
Monocytes Absolute: 0.4 10*3/uL (ref 0.1–1.0)
Monocytes Relative: 8.1 % (ref 3.0–12.0)
Neutro Abs: 2.9 10*3/uL (ref 1.4–7.7)
Neutrophils Relative %: 59.2 % (ref 43.0–77.0)
Platelets: 272 10*3/uL (ref 150.0–400.0)
RBC: 4.19 Mil/uL (ref 3.87–5.11)
RDW: 12.3 % (ref 11.5–15.5)
WBC: 4.9 10*3/uL (ref 4.0–10.5)

## 2024-03-31 LAB — LIPID PANEL
Cholesterol: 255 mg/dL — ABNORMAL HIGH (ref 0–200)
HDL: 47.6 mg/dL (ref >39.00)
LDL Cholesterol: 191 mg/dL — ABNORMAL HIGH (ref 0–99)
NonHDL: 207.83
Total CHOL/HDL Ratio: 5
Triglycerides: 86 mg/dL (ref 0.0–149.0)
VLDL: 17.2 mg/dL (ref 0.0–40.0)

## 2024-04-01 ENCOUNTER — Ambulatory Visit: Payer: Self-pay | Admitting: Internal Medicine

## 2024-04-01 DIAGNOSIS — E782 Mixed hyperlipidemia: Secondary | ICD-10-CM

## 2024-04-01 LAB — HIV ANTIBODY (ROUTINE TESTING W REFLEX): HIV 1&2 Ab, 4th Generation: NONREACTIVE

## 2024-04-01 LAB — HEPATITIS C ANTIBODY: Hepatitis C Ab: NONREACTIVE

## 2024-04-01 MED ORDER — ROSUVASTATIN CALCIUM 5 MG PO TABS: 5.0000 mg | ORAL_TABLET | Freq: Every day | ORAL | 1 refills | Status: DC

## 2024-05-22 ENCOUNTER — Encounter: Payer: Self-pay | Admitting: Pediatrics

## 2024-07-25 ENCOUNTER — Ambulatory Visit

## 2024-07-25 VITALS — Ht 62.0 in | Wt 166.0 lb

## 2024-07-25 DIAGNOSIS — Z1211 Encounter for screening for malignant neoplasm of colon: Secondary | ICD-10-CM

## 2024-07-25 MED ORDER — NA SULFATE-K SULFATE-MG SULF 17.5-3.13-1.6 GM/177ML PO SOLN: 1.0000 | Freq: Once | ORAL | 0 refills | Status: AC

## 2024-07-25 MED ORDER — ONDANSETRON HCL 4 MG PO TABS: 4.0000 mg | ORAL_TABLET | ORAL | 0 refills | Status: AC

## 2024-07-25 NOTE — Progress Notes (Signed)
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
## 2024-08-04 ENCOUNTER — Encounter: Payer: Self-pay | Admitting: Pediatrics

## 2024-08-04 ENCOUNTER — Ambulatory Visit: Admitting: Pediatrics

## 2024-08-04 VITALS — BP 116/67 | HR 79 | Temp 97.8°F | Resp 15 | Ht 62.0 in | Wt 166.0 lb

## 2024-08-04 DIAGNOSIS — Z1211 Encounter for screening for malignant neoplasm of colon: Secondary | ICD-10-CM

## 2024-08-04 MED ORDER — SODIUM CHLORIDE 0.9 % IV SOLN: 500.0000 mL | INTRAVENOUS | Status: DC

## 2024-08-04 NOTE — Progress Notes (Signed)
 Transferred to PACU via stretcher.  Not responding to stimulation at this time.  VSS upon leaving procedure room.

## 2024-08-04 NOTE — Op Note (Signed)
 [facility] Patient Name: Pamela Kline Procedure Date: 08/04/2024 10:52 AM MRN: [id number] Endoscopist: Inocente Hausen , MD, [id number] Age: 43 Referring MD:  Date of Birth: Jul 04, 1981 Gender: Female Account #: [id number] Procedure:                Colonoscopy Indications:              Screening for colorectal malignant neoplasm, This                            is the patient's first colonoscopy Medicines:                Monitored Anesthesia Care Procedure:                Pre-Anesthesia Assessment:                           - Prior to the procedure, a History and Physical                            was performed, and patient medications and                            allergies were reviewed. The patient's tolerance of                            previous anesthesia was also reviewed. The risks                            and benefits of the procedure and the sedation                            options and risks were discussed with the patient.                            All questions were answered, and informed consent                            was obtained. Prior Anticoagulants: The patient has                            taken no anticoagulant or antiplatelet agents. ASA                            Grade Assessment: II - A patient with mild systemic                            disease. After reviewing the risks and benefits,                            the patient was deemed in satisfactory condition to                            undergo the procedure.  After obtaining informed consent, the colonoscope                            was passed under direct vision. Throughout the                            procedure, the patient's blood pressure, pulse, and                            oxygen saturations were monitored continuously. The                            CF [id number] #[id number] was introduced through the anus                            and advanced to the  cecum, identified by                            appendiceal orifice and ileocecal valve. The                            colonoscopy was performed without difficulty. The                            patient tolerated the procedure well. The quality                            of the bowel preparation was good. The ileocecal                            valve, appendiceal orifice, and rectum were                            photographed. Scope In: 11:08:32 AM Scope Out: 11:22:21 AM Scope Withdrawal Time: 0 hours 10 minutes 23 seconds  Total Procedure Duration: 0 hours 13 minutes 49 seconds  Findings:                 The perianal and digital rectal examinations were                            normal. Pertinent negatives include normal                            sphincter tone and no palpable rectal lesions.                           The colon (entire examined portion) appeared normal.                           The retroflexed view of the distal rectum and anal                            verge was normal and showed no anal or rectal  abnormalities. Complications:            No immediate complications. Estimated blood loss:                            None. Estimated Blood Loss:     Estimated blood loss: none. Impression:               - The entire examined colon is normal.                           - The distal rectum and anal verge are normal on                            retroflexion view.                           - No specimens collected. Recommendation:           - Discharge patient to home (ambulatory).                           - Await pathology results.                           - Repeat colonoscopy in 10 years for screening                            purposes.                           - The findings and recommendations were discussed                            with the patient's family.                           - Return to referring physician.                            - Patient has a contact number available for                            emergencies. The signs and symptoms of potential                            delayed complications were discussed with the                            patient. Return to normal activities tomorrow.                            Written discharge instructions were provided to the                            patient. Inocente Hausen, MD 08/04/2024 11:27:04 AM This report has been signed electronically.

## 2024-08-04 NOTE — Patient Instructions (Signed)
   Continue previous diet & medications   YOU HAD AN ENDOSCOPIC PROCEDURE TODAY AT THE [facility]:   Refer to the procedure report that was given to you for any specific questions about what was found during the examination.  If the procedure report does not answer your questions, please call your gastroenterologist to clarify.  If you requested that your care partner not be given the details of your procedure findings, then the procedure report has been included in a sealed envelope for you to review at your convenience later.  YOU SHOULD EXPECT: Some feelings of bloating in the abdomen. Passage of more gas than usual.  Walking can help get rid of the air that was put into your GI tract during the procedure and reduce the bloating. If you had a lower endoscopy (such as a colonoscopy or flexible sigmoidoscopy) you may notice spotting of blood in your stool or on the toilet paper. If you underwent a bowel prep for your procedure, you may not have a normal bowel movement for a few days.  Please Note:  You might notice some irritation and congestion in your nose or some drainage.  This is from the oxygen used during your procedure.  There is no need for concern and it should clear up in a day or so.  SYMPTOMS TO REPORT IMMEDIATELY:  Following lower endoscopy (colonoscopy or flexible sigmoidoscopy):  Excessive amounts of blood in the stool  Significant tenderness or worsening of abdominal pains  Swelling of the abdomen that is new, acute  Fever of 100F or higher   For urgent or emergent issues, a gastroenterologist can be reached at any hour by calling [phone]. Do not use MyChart messaging for urgent concerns.    DIET:  We do recommend a small meal at first, but then you may proceed to your regular diet.  Drink plenty of fluids but you should avoid alcoholic beverages for 24 hours.  ACTIVITY:  You should plan to take it easy for the rest of today and you should NOT DRIVE or  use heavy machinery until tomorrow (because of the sedation medicines used during the test).    FOLLOW UP: Our staff will call the number listed on your records the next business day following your procedure.  We will call around 7:15- 8:00 am to check on you and address any questions or concerns that you may have regarding the information given to you following your procedure. If we do not reach you, we will leave a message.     If any biopsies were taken you will be contacted by phone or by letter within the next 1-3 weeks.  Please call us at [phone] if you have not heard about the biopsies in 3 weeks.    SIGNATURES/CONFIDENTIALITY: You and/or your care partner have signed paperwork which will be entered into your electronic medical record.  These signatures attest to the fact that that the information above on your After Visit Summary has been reviewed and is understood.  Full responsibility of the confidentiality of this discharge information lies with you and/or your care-partner.

## 2024-08-04 NOTE — Progress Notes (Signed)
 Pt's states no medical or surgical changes since previsit or office visit.

## 2024-08-05 ENCOUNTER — Telehealth: Payer: Self-pay

## 2024-08-05 NOTE — Telephone Encounter (Signed)
  Follow up Call-     08/04/2024    9:46 AM  Call back number  Post procedure Call Back phone  # [phone]  Permission to leave phone message Yes     Patient questions:  Do you have a fever, pain , or abdominal swelling? No. Pain Score  0 *  Have you tolerated food without any problems? Yes.    Have you been able to return to your normal activities? Yes.    Do you have any questions about your discharge instructions: Diet   No. Medications  No. Follow up visit  No.  Do you have questions or concerns about your Care? No.  Actions: * If pain score is 4 or above: No action needed, pain <4.

## 2024-09-27 ENCOUNTER — Other Ambulatory Visit: Payer: Self-pay | Admitting: Internal Medicine

## 2024-09-27 DIAGNOSIS — E782 Mixed hyperlipidemia: Secondary | ICD-10-CM
